# Patient Record
Sex: Female | Born: 1990 | Marital: Single | Smoking: Never smoker
Health system: Southern US, Community
[De-identification: ages and names within clinical notes are randomized; demographics above are authoritative.]

## PROBLEM LIST (undated history)

## (undated) DIAGNOSIS — Z872 Personal history of diseases of the skin and subcutaneous tissue: Secondary | ICD-10-CM

## (undated) HISTORY — DX: Personal history of diseases of the skin and subcutaneous tissue: Z87.2

---

## 2019-10-17 ENCOUNTER — Ambulatory Visit: Payer: BC Managed Care – PPO | Attending: Internal Medicine

## 2019-10-17 DIAGNOSIS — Z20822 Contact with and (suspected) exposure to covid-19: Secondary | ICD-10-CM

## 2019-10-18 LAB — NOVEL CORONAVIRUS, NAA: SARS-CoV-2, NAA: NOT DETECTED

## 2019-10-30 ENCOUNTER — Ambulatory Visit: Payer: BC Managed Care – PPO | Attending: Internal Medicine

## 2019-10-30 DIAGNOSIS — Z20822 Contact with and (suspected) exposure to covid-19: Secondary | ICD-10-CM | POA: Insufficient documentation

## 2019-10-31 LAB — NOVEL CORONAVIRUS, NAA: SARS-CoV-2, NAA: NOT DETECTED

## 2019-11-24 ENCOUNTER — Ambulatory Visit: Payer: Self-pay

## 2019-12-04 DIAGNOSIS — J189 Pneumonia, unspecified organism: Secondary | ICD-10-CM | POA: Diagnosis not present

## 2019-12-04 DIAGNOSIS — Z20822 Contact with and (suspected) exposure to covid-19: Secondary | ICD-10-CM | POA: Diagnosis not present

## 2019-12-04 DIAGNOSIS — R05 Cough: Secondary | ICD-10-CM | POA: Diagnosis not present

## 2019-12-05 ENCOUNTER — Ambulatory Visit: Payer: Self-pay | Attending: Internal Medicine

## 2019-12-05 DIAGNOSIS — Z23 Encounter for immunization: Secondary | ICD-10-CM | POA: Insufficient documentation

## 2019-12-05 NOTE — Progress Notes (Signed)
   Covid-19 Vaccination Clinic  Name:  [name]    MRN: [id number] DOB: 05-Jan-1991  12/05/2019  [name] was observed post Covid-19 immunization for 15 minutes without incident. She was provided with Vaccine Information Sheet and instruction to access the V-Safe system.   [name] was instructed to call 911 with any severe reactions post vaccine: Marland Kitchen Difficulty breathing  . Swelling of face and throat  . A fast heartbeat  . A bad rash all over body  . Dizziness and weakness   Immunizations Administered    Name Date Dose VIS Date Route   Pfizer COVID-19 Vaccine 12/05/2019  8:47 AM 0.3 mL 09/12/2019 Intramuscular   Manufacturer: [place]   Lot: [id number]   [place]: [id number]

## 2019-12-11 DIAGNOSIS — R05 Cough: Secondary | ICD-10-CM | POA: Diagnosis not present

## 2019-12-11 DIAGNOSIS — J189 Pneumonia, unspecified organism: Secondary | ICD-10-CM | POA: Diagnosis not present

## 2019-12-26 ENCOUNTER — Ambulatory Visit: Payer: Self-pay | Attending: Internal Medicine

## 2019-12-26 ENCOUNTER — Other Ambulatory Visit: Payer: Self-pay

## 2019-12-26 DIAGNOSIS — Z23 Encounter for immunization: Secondary | ICD-10-CM

## 2020-01-12 ENCOUNTER — Ambulatory Visit
Admission: EM | Admit: 2020-01-12 | Discharge: 2020-01-12 | Disposition: A | Discharge status: Home / Self Care | Payer: BC Managed Care – PPO

## 2020-01-12 ENCOUNTER — Ambulatory Visit (INDEPENDENT_AMBULATORY_CARE_PROVIDER_SITE_OTHER): Payer: BC Managed Care – PPO

## 2020-01-12 ENCOUNTER — Other Ambulatory Visit: Payer: Self-pay

## 2020-01-12 DIAGNOSIS — R05 Cough: Secondary | ICD-10-CM | POA: Diagnosis not present

## 2020-01-12 DIAGNOSIS — R062 Wheezing: Secondary | ICD-10-CM | POA: Diagnosis not present

## 2020-01-12 DIAGNOSIS — R053 Chronic cough: Secondary | ICD-10-CM

## 2020-01-12 MED ORDER — E-Z SPACER DEVI: 2 refills | Status: DC

## 2020-01-12 MED ORDER — BENZONATATE 100 MG PO CAPS: 200.0000 mg | ORAL_CAPSULE | Freq: Three times a day (TID) | ORAL | 0 refills | Status: DC | PRN

## 2020-01-12 MED ORDER — PREDNISONE 20 MG PO TABS: 40.0000 mg | ORAL_TABLET | Freq: Every day | ORAL | 0 refills | Status: DC

## 2020-01-12 NOTE — ED Triage Notes (Addendum)
Pt states she was diagnosed in March with pneumonia. Given antibiotics x 2 rounds. States she doesn't feel like she ever completely got better and last week started feeling worse cough and had an overnight fever which has resolved. She notices SOB with exertion. Would like repeat CXR

## 2020-01-12 NOTE — Discharge Instructions (Signed)
It was very nice seeing you today in clinic. Thank you for entrusting me with your care.   Please utilize the medications that we discussed. Your prescriptions has been called in to your pharmacy.   Make arrangements to follow up with your regular doctor in 1 week for re-evaluation if not improving. If your symptoms/condition worsens, please seek follow up care either here or in the ER. Please remember, our [place] providers are "right here with you" when you need [place].   Again, it was my pleasure to take care of you today. Thank you for choosing our clinic. I hope that you start to feel better quickly.   Honor Loh, MSN, APRN, FNP-C, CEN Advanced Practice Provider [facility]

## 2020-01-14 NOTE — ED Provider Notes (Signed)
[place], [place]   Name: John Recendiz DOB: 01/07/1991 MRN: [id number] CSN: [id number] PCP: Patient, No Pcp Per  Arrival date and time:  01/12/20 1935  Chief Complaint:  Cough  NOTE: Prior to seeing the patient today, I have reviewed the triage nursing documentation and vital signs. Clinical staff has updated patient's PMH/PSHx, current medication list, and drug allergies/intolerances to ensure comprehensive history available to assist in medical decision making.   History:   HPI: Karnisha Lainhart is a 29 y.o. female who presents today with complaints of chronic cough since March. She was diagnosed with CAP by [facility]; notes unavailable for reviewed. She reports that she has been treated with two different antibiotics (Augmentin and Azithromycin). Patient advising that she does not feel as if she ever really improved. About a week ago, her cough worsened again. She had subjective fevers on Tuesday (5-6 days ago) that were self limiting. Patient denies being in close contact with anyone known to be ill. She has not been tested for SARS-CoV-2 (novel coronavirus) in the past 14 days, and does not wish to be tested today. 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 She presents today requesting a repeat CXR to determine the cause of her continued cough. She has a SABA (albuterol) MDI at home, however notes that it makes her gag with use, which in turn causes her to vomit and urinate on herself. She is not currently taking any sort of anti-tussive medications.   History reviewed. No pertinent past medical history.  History reviewed. No pertinent surgical history.  History reviewed. No pertinent family history.  Social History   Tobacco Use  . Smoking status: Never Smoker  . Smokeless tobacco: Never Used  Substance Use Topics  . Alcohol use: Not Currently  . Drug use: Not Currently    There are no problems to display for this  patient.   Home Medications:    Current Meds  Medication Sig  . levonorgestrel (MIRENA) 20 MCG/24HR IUD 1 each by Intrauterine route once.    Allergies:   Patient has no known allergies.  Review of Systems (ROS):  Review of systems NEGATIVE unless otherwise noted in narrative H&P section.   Vital Signs: Today's Vitals   01/12/20 1944 01/12/20 1947 01/12/20 2025  BP: (!) 148/88    Pulse: 79    Resp: 18    Temp: 98.7 F (37.1 C)    TempSrc: Oral    SpO2: 100%    Weight:  220 lb (99.8 kg)   Height:  5\' 9"  (1.753 m)   PainSc:  0-No pain 0-No pain    Physical Exam: Physical Exam  Constitutional: She is oriented to person, place, and time and well-developed, well-nourished, and in no distress.  HENT:  Head: Normocephalic and atraumatic.  Nose: Nose normal.  Mouth/Throat: Uvula is midline, oropharynx is clear and moist and mucous membranes are normal.  Eyes: Pupils are equal, round, and reactive to light.  Cardiovascular: Normal rate, regular rhythm, normal heart sounds and intact distal pulses.  Pulmonary/Chest: Effort normal. She has wheezes (faint expiratory; scattered).  Mild cough noted in clinic. No SOB or increased WOB. No distress. Able to speak in complete sentences without difficulties. SPO2 100% on RA.  Neurological: She is alert and oriented to person, place, and time. Gait normal.  Skin: Skin is warm and dry. No rash noted. She is not diaphoretic.  Psychiatric: Mood, memory, affect and judgment normal.  Nursing note  and vitals reviewed.   Urgent Care Treatments / Results:   Orders Placed This Encounter  Procedures  . DG Chest 2 View    LABS: PLEASE NOTE: all labs that were ordered this encounter are listed, however only abnormal results are displayed. Labs Reviewed - No data to display  EKG: -None  RADIOLOGY: DG Chest 2 View  Result Date: 01/12/2020 CLINICAL DATA:  Cough EXAM: CHEST - 2 VIEW COMPARISON:  None. FINDINGS: The heart size and  mediastinal contours are within normal limits. Both lungs are clear. The visualized skeletal structures are unremarkable. IMPRESSION: No acute abnormality of the lungs. Electronically Signed   By: Eddie Candle M.D.   On: 01/12/2020 20:03    PROCEDURES: Procedures  MEDICATIONS RECEIVED THIS VISIT: Medications - No data to display  PERTINENT CLINICAL COURSE NOTES/UPDATES:   Initial Impression / Assessment and Plan / Urgent Care Course:  Pertinent labs & imaging results that were available during my care of the patient were personally reviewed by me and considered in my medical decision making (see lab/imaging section of note for values and interpretations).  Nichelle Lawyer is a 29 y.o. female who presents to [facility] today with complaints of Cough  Patient is well appearing overall in clinic today. She does not appear to be in any acute distress. Presenting symptoms (see HPI) and exam as documented above. Patient with cough since March following her CAP diagnosis. She has been treated with two antibiotics. Cough persists. No fevers today. She is eating and drinking well. Declines SARS-CoV-2 testing today citing that she has completed the two step Pfizer series. Radiographs of the chest performed today revealed no acute cardiopulmonary process; no evidence of peribronchial thickening, areas of consolidation, or focal infiltrates. Cough is chronic at this point. Will provide a short steroid burst to help with her symptoms. Recommended for her to continue SABA inhaler, however she advises it makes her sick. Will trial with a spacer; Rx sent to patient's pharmacy. Discussed supportive care measures at home. Patient to rest as much as possible. She was encouraged to ensure adequate hydration (water and ORS) to prevent dehydration and electrolyte derangements. Patient may use APAP and/or IBU on an as needed basis for pain/fever. Will send in a supply of benzonatate (Tessalon) for patient to use on  a PRN basis to help with her cough.   Discussed follow up with primary care physician in 1 week for re-evaluation. I have reviewed the follow up and strict return precautions for any new or worsening symptoms. Patient is aware of symptoms that would be deemed urgent/emergent, and would thus require further evaluation either here or in the emergency department. At the time of discharge, she verbalized understanding and consent with the discharge plan as it was reviewed with her. All questions were fielded by provider and/or clinic staff prior to patient discharge.    Final Clinical Impressions / Urgent Care Diagnoses:   Final diagnoses:  Chronic cough    New Prescriptions:  [place] Controlled Substance Registry consulted? Not Applicable  Meds ordered this encounter  Medications  . predniSONE (DELTASONE) 20 MG tablet    Sig: Take 2 tablets (40 mg total) by mouth daily.    Dispense:  10 tablet    Refill:  0  . benzonatate (TESSALON) 100 MG capsule    Sig: Take 2 capsules (200 mg total) by mouth 3 (three) times daily as needed for cough.    Dispense:  21 capsule    Refill:  0  .  Spacer/Aero-Holding Chambers (E-Z SPACER) inhaler    Sig: Use as instructed    Dispense:  1 each    Refill:  2    Recommended Follow up Care:  Patient encouraged to follow up with the following provider within the specified time frame, or sooner as dictated by the severity of her symptoms. As always, she was instructed that for any urgent/emergent care needs, she should seek care either here or in the emergency department for more immediate evaluation.  Follow-up Information    PCP In 1 week.   Why: General reassessment of symptoms if not improving        NOTE: This note was prepared using Lobbyist along with smaller Company secretary. Despite my best ability to proofread, there is the potential that transcriptional errors may still occur from this process, and are completely unintentional.      Karen Kitchens, NP 01/14/20 2018

## 2020-02-02 DIAGNOSIS — L209 Atopic dermatitis, unspecified: Secondary | ICD-10-CM | POA: Diagnosis not present

## 2020-05-03 ENCOUNTER — Other Ambulatory Visit: Payer: Self-pay

## 2020-05-03 ENCOUNTER — Other Ambulatory Visit: Payer: BC Managed Care – PPO

## 2020-05-03 DIAGNOSIS — Z20822 Contact with and (suspected) exposure to covid-19: Secondary | ICD-10-CM | POA: Diagnosis not present

## 2020-05-04 LAB — SARS-COV-2, NAA 2 DAY TAT

## 2020-05-04 LAB — NOVEL CORONAVIRUS, NAA: SARS-CoV-2, NAA: NOT DETECTED

## 2020-05-10 ENCOUNTER — Other Ambulatory Visit: Payer: BC Managed Care – PPO

## 2020-06-12 DIAGNOSIS — Z20822 Contact with and (suspected) exposure to covid-19: Secondary | ICD-10-CM | POA: Diagnosis not present

## 2021-01-24 ENCOUNTER — Ambulatory Visit (INDEPENDENT_AMBULATORY_CARE_PROVIDER_SITE_OTHER): Payer: BC Managed Care – PPO

## 2021-01-24 ENCOUNTER — Other Ambulatory Visit: Payer: Self-pay

## 2021-01-24 ENCOUNTER — Ambulatory Visit (HOSPITAL_COMMUNITY)
Admission: RE | Admit: 2021-01-24 | Discharge: 2021-01-24 | Disposition: A | Discharge status: Home / Self Care | Payer: BC Managed Care – PPO | Source: Ambulatory Visit

## 2021-01-24 ENCOUNTER — Encounter (HOSPITAL_COMMUNITY): Payer: Self-pay

## 2021-01-24 VITALS — BP 127/73 | HR 75 | Temp 98.6°F | Resp 18

## 2021-01-24 DIAGNOSIS — R059 Cough, unspecified: Secondary | ICD-10-CM

## 2021-01-24 MED ORDER — MONTELUKAST SODIUM 10 MG PO TABS: 10.0000 mg | ORAL_TABLET | Freq: Every day | ORAL | 0 refills | Status: DC

## 2021-01-24 MED ORDER — ALBUTEROL SULFATE HFA 108 (90 BASE) MCG/ACT IN AERS: 1.0000 | INHALATION_SPRAY | Freq: Four times a day (QID) | RESPIRATORY_TRACT | 0 refills | Status: DC | PRN

## 2021-01-24 MED ORDER — LEVOCETIRIZINE DIHYDROCHLORIDE 5 MG PO TABS: 5.0000 mg | ORAL_TABLET | Freq: Every evening | ORAL | 0 refills | Status: DC

## 2021-01-24 MED ORDER — PROMETHAZINE-DM 6.25-15 MG/5ML PO SYRP
5.0000 mL | ORAL_SOLUTION | Freq: Every evening | ORAL | 0 refills | Status: DC | PRN
Start: 2021-01-24 — End: 2021-11-07

## 2021-01-24 NOTE — ED Triage Notes (Signed)
Pt reports cough x 2 weeks. Pt states she "feel the same" as when se had pneumonia last year.  Pt denies any other symptoms.   Reports negative rapid COVID test at home.

## 2021-01-24 NOTE — ED Provider Notes (Signed)
[place]   MRN: [id number] DOB: 1990/12/30  Subjective:   Anzleigh Slaven is a 30 y.o. female presenting for 2-week history of persistent cough, chest tightness, malaise and fatigue.  Patient states that she had pneumonia last year feels like this is happening again.  Denies chest pain, shortness of breath, fevers, body aches.  She did do a COVID test at home and was negative.  She denies any history of respiratory disorders, is not a smoker.  No current facility-administered medications for this encounter.  Current Outpatient Medications:  .  loratadine (CLARITIN) 10 MG tablet, Take 10 mg by mouth daily., Disp: , Rfl:  .  albuterol (VENTOLIN HFA) 108 (90 Base) MCG/ACT inhaler, SMARTSIG:2 Puff(s) By Mouth Every 4 Hours PRN, Disp: , Rfl:  .  benzonatate (TESSALON) 100 MG capsule, Take 2 capsules (200 mg total) by mouth 3 (three) times daily as needed for cough., Disp: 21 capsule, Rfl: 0 .  levonorgestrel (MIRENA) 20 MCG/24HR IUD, 1 each by Intrauterine route once., Disp: , Rfl:  .  predniSONE (DELTASONE) 20 MG tablet, Take 2 tablets (40 mg total) by mouth daily., Disp: 10 tablet, Rfl: 0 .  Spacer/Aero-Holding Chambers (E-Z SPACER) inhaler, Use as instructed, Disp: 1 each, Rfl: 2   No Known Allergies  History reviewed. No pertinent past medical history.   History reviewed. No pertinent surgical history.  History reviewed. No pertinent family history.  Social History   Tobacco Use  . Smoking status: Never Smoker  . Smokeless tobacco: Never Used  Vaping Use  . Vaping Use: Never used  Substance Use Topics  . Alcohol use: Not Currently  . Drug use: Not Currently    ROS   Objective:   Vitals: BP 127/73 (BP Location: Left Arm)   Pulse 75   Temp 98.6 F (37 C) (Oral)   Resp 18   SpO2 96%   Physical Exam Constitutional:      General: She is not in acute distress.    Appearance: Normal appearance. She is well-developed. She is not ill-appearing,  toxic-appearing or diaphoretic.  HENT:     Head: Normocephalic and atraumatic.     Nose: Nose normal.     Mouth/Throat:     Mouth: Mucous membranes are moist.  Eyes:     Extraocular Movements: Extraocular movements intact.     Pupils: Pupils are equal, round, and reactive to light.  Cardiovascular:     Rate and Rhythm: Normal rate and regular rhythm.     Pulses: Normal pulses.     Heart sounds: Normal heart sounds. No murmur heard. No friction rub. No gallop.   Pulmonary:     Effort: Pulmonary effort is normal. No respiratory distress.     Breath sounds: No stridor. No wheezing, rhonchi or rales.     Comments: Slightly decreased lung sounds in the bibasilar fields. Skin:    General: Skin is warm and dry.     Findings: No rash.  Neurological:     Mental Status: She is alert and oriented to person, place, and time.  Psychiatric:        Mood and Affect: Mood normal.        Behavior: Behavior normal.        Thought Content: Thought content normal.     DG Chest 2 View  Result Date: 01/24/2021 CLINICAL DATA:  Cough for 2 weeks EXAM: CHEST - 2 VIEW COMPARISON:  Prior chest x-ray 01/12/2020 FINDINGS: The lungs are clear and  negative for focal airspace consolidation, pulmonary edema or suspicious pulmonary nodule. No pleural effusion or pneumothorax. Cardiac and mediastinal contours are within normal limits. No acute fracture or lytic or blastic osseous lesions. The visualized upper abdominal bowel gas pattern is unremarkable. IMPRESSION: No active cardiopulmonary disease. Electronically Signed   By: Jacqulynn Cadet M.D.   On: 01/24/2021 10:53     Assessment and Plan :   PDMP not reviewed this encounter.  1. Cough     Patient had similar symptoms set a year ago.  There is no sign of an acute cardiopulmonary process.  Recommend use an allergy regimen given the timeline of her symptoms both for the spring season and the last.  Recommended Xyzal, Singulair, albuterol and general  supportive care.  Deferred COVID-19 testing.  Recommended she establish care with a new PCP and if her respiratory symptoms persist, contact [place] pulmonary care for consultation. Counseled patient on potential for adverse effects with medications prescribed/recommended today, ER and return-to-clinic precautions discussed, patient verbalized understanding.    Jaynee Eagles, PA-C 01/24/21 1314

## 2021-03-21 DIAGNOSIS — M419 Scoliosis, unspecified: Secondary | ICD-10-CM | POA: Diagnosis not present

## 2021-03-21 DIAGNOSIS — M5442 Lumbago with sciatica, left side: Secondary | ICD-10-CM | POA: Diagnosis not present

## 2021-03-26 ENCOUNTER — Encounter (HOSPITAL_COMMUNITY): Payer: Self-pay

## 2021-03-26 ENCOUNTER — Other Ambulatory Visit: Payer: Self-pay

## 2021-03-26 ENCOUNTER — Ambulatory Visit (HOSPITAL_COMMUNITY)
Admission: RE | Admit: 2021-03-26 | Discharge: 2021-03-26 | Disposition: A | Discharge status: Home / Self Care | Payer: BC Managed Care – PPO | Source: Ambulatory Visit | Attending: Urgent Care | Admitting: Urgent Care

## 2021-03-26 VITALS — BP 131/91 | HR 96 | Temp 98.3°F | Resp 16

## 2021-03-26 DIAGNOSIS — R200 Anesthesia of skin: Secondary | ICD-10-CM

## 2021-03-26 DIAGNOSIS — M545 Low back pain, unspecified: Secondary | ICD-10-CM

## 2021-03-26 MED ORDER — GABAPENTIN 100 MG PO CAPS: 100.0000 mg | ORAL_CAPSULE | Freq: Three times a day (TID) | ORAL | 0 refills | Status: DC

## 2021-03-26 MED ORDER — NAPROXEN 375 MG PO TABS: 375.0000 mg | ORAL_TABLET | Freq: Two times a day (BID) | ORAL | 0 refills | Status: DC

## 2021-03-26 MED ORDER — TIZANIDINE HCL 4 MG PO TABS: 4.0000 mg | ORAL_TABLET | Freq: Three times a day (TID) | ORAL | 0 refills | Status: DC | PRN

## 2021-03-26 NOTE — ED Triage Notes (Signed)
Pt present back pain with numbness that has gotten worst. Pt states she previous went to an urgent care for the same symptoms on Monday and was informed to come back if numbness has gotten worst.

## 2021-11-07 ENCOUNTER — Encounter: Payer: Self-pay | Admitting: Physician Assistant

## 2021-11-07 ENCOUNTER — Ambulatory Visit: Payer: BC Managed Care – PPO | Admitting: Physician Assistant

## 2021-11-07 ENCOUNTER — Other Ambulatory Visit: Payer: Self-pay

## 2021-11-07 VITALS — BP 126/84 | HR 70 | Temp 98.5°F | Ht 68.11 in | Wt 248.4 lb

## 2021-11-07 DIAGNOSIS — E049 Nontoxic goiter, unspecified: Secondary | ICD-10-CM | POA: Diagnosis not present

## 2021-11-07 DIAGNOSIS — R5383 Other fatigue: Secondary | ICD-10-CM | POA: Diagnosis not present

## 2021-11-07 DIAGNOSIS — M79642 Pain in left hand: Secondary | ICD-10-CM

## 2021-11-07 DIAGNOSIS — M79641 Pain in right hand: Secondary | ICD-10-CM

## 2021-11-07 DIAGNOSIS — F341 Dysthymic disorder: Secondary | ICD-10-CM | POA: Diagnosis not present

## 2021-11-07 DIAGNOSIS — R7303 Prediabetes: Secondary | ICD-10-CM | POA: Insufficient documentation

## 2021-11-07 MED ORDER — MELOXICAM 15 MG PO TABS: 15.0000 mg | ORAL_TABLET | Freq: Every day | ORAL | 0 refills | Status: DC

## 2021-11-07 NOTE — Patient Instructions (Signed)
It was great to see you!  Start mobic 15 mg on the days that you work  Trial the exercises for your hand  We will update blood work today  Let's follow-up in 4-6 weeks to follow up on your mood, fatigue and discuss weight loss, sooner if you have concerns.  Take care,  Inda Coke PA-C

## 2021-11-07 NOTE — Progress Notes (Signed)
Victoria Barnett is a 31 y.o. female here to establish care.  History of Present Illness:   Chief Complaint  Patient presents with   Establish Care    Pt coming in to establish care; pt is requesting to have thyroid checked Pt c/o developing arthritis in hands and possible ankles; also interested in weight management    Acute Concerns: Enlarged Thyroid  According to pt, while she was undergoing a routine physical examination in [place] , she was found to have an enlarged thyroid. States that following this she had an ultrasound completed, but is unable to remember exactly what was found. At this time she is interested in updating her blood work and/or ultrasound regularly to make sure nothing significant is occuring.   Bilateral Hand Pain For the past couple of months, pt has been experiencing bilateral hand pain. 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 Expresses she feels an achy sensation across her knuckle area that has gotten as bad as a 4 out of 10.   Denies numbness/tingling, weakness, radiating pain, or decreased ROM.  Fatigue  Victoria Barnett expresses she has been experiencing increased feelings of fatigue. According to pt, she is unsure as to why she is feeling as tired since she only works 3 days a week, leaving her 4 days to rest. Pt does admit that she has felt a decrease in her mood, but isn't sure that could be contributing to this issue. At this time, she has been consuming a mostly vegan diet for 6 months, but has been taking a daily protein powder to provide her with multivitamins. Denies concerns for sleep apnea, changes in routine, or racing thoughts.   Hx of Pre-Diabetes Victoria Barnett states that following routine blood work screening provided by her job a couple of years ago, she was told her HgbA1c was in the pre-diabetic range. Pt states since then she has made it a point to no longer be in this pre-diabetic or diabetic range.  She was unable to remember or find the actual lab result today, but was able to find recent A1c that was 4.8. Denies concerning sx.    Depression screen PHQ 2/9 11/07/2021  Decreased Interest 2  Down, Depressed, Hopeless 0  PHQ - 2 Score 2  Altered sleeping 0  Tired, decreased energy 2  Change in appetite 2  Feeling bad or failure about yourself  0  Trouble concentrating 2  Moving slowly or fidgety/restless 0  Suicidal thoughts 0  PHQ-9 Score 8  Difficult doing work/chores Somewhat difficult    No flowsheet data found.   Other providers/specialists: Patient Care Team: Inda Coke, [place] as PCP - General (Physician Assistant)   History reviewed. No pertinent past medical history.   Social History   Tobacco Use   Smoking status: Never   Smokeless tobacco: Never  Vaping Use   Vaping Use: Never used  Substance Use Topics   Alcohol use: Not Currently   Drug use: Never    History reviewed. No pertinent surgical history.  Family History  Problem Relation Age of Onset   Lupus Maternal Grandmother    Arthritis Maternal Grandmother    Diabetes Maternal Grandfather    Dementia Maternal Grandfather    Esophageal cancer Paternal Grandmother    Thyroid cancer Cousin    Diabetes Cousin    Colon cancer Neg Hx    Breast cancer Neg Hx     No Known Allergies  Current Medications:   Current Outpatient Medications:    meloxicam (MOBIC) 15 MG tablet, Take 1 tablet (15 mg total) by mouth daily., Disp: 30 tablet, Rfl: 0   levonorgestrel (MIRENA) 20 MCG/24HR IUD, 1 each by Intrauterine route once., Disp: , Rfl:    Review of Systems:   ROS Negative unless otherwise specified per HPI.  Vitals:   Vitals:   11/07/21 1322  BP: 126/84  Pulse: 70  Temp: 98.5 F (36.9 C)  TempSrc: Temporal  SpO2: 98%  Weight: 248 lb 6.4 oz (112.7 kg)  Height: 5' 8.11" (1.73 m)      Body mass index is 37.65 kg/m.  Physical Exam:   Physical Exam Vitals and nursing note reviewed.   Constitutional:      General: She is not in acute distress.    Appearance: She is well-developed. She is not ill-appearing or toxic-appearing.  Cardiovascular:     Rate and Rhythm: Normal rate and regular rhythm.     Pulses: Normal pulses.     Heart sounds: Normal heart sounds, S1 normal and S2 normal.  Pulmonary:     Effort: Pulmonary effort is normal.     Breath sounds: Normal breath sounds.  Skin:    General: Skin is warm and dry.  Neurological:     Mental Status: She is alert.     GCS: GCS eye subscore is 4. GCS verbal subscore is 5. GCS motor subscore is 6.  Psychiatric:        Speech: Speech normal.        Behavior: Behavior normal. Behavior is cooperative.    Assessment and Plan:   Prediabetes No evidence based on blood work that she has brought from [employer] screenings Will continue to work on healthy lifestyle efforts, I have encouraged her to follow-up with me in a month or so to discuss weight loss  Bilateral hand pain No red flags  Suspect pain 2/2 over-use Start meloxicam 15 mg daily on days when she works Trial hand exercises for additional relief--provided handout information Follow up in 4-6 weeks, sooner if concerns -- will refer to sports medicine if lack of improvement or changes  Other fatigue; Dysthymia No red flags Does have some mild depression, but declines SI/HI or treatment for this at this time Blood work from [employer] reviewed and does not show any abnormalities Will update B12 given recent vegan diet changes and thyroid panel per her request Encouraged adequate rest, fluids -- consider additional work-up in 4-6 weeks if ongoing/worsening  - Vitamin B12   Enlarged thyroid Update labs today, will make recommendations accordingly  We are requesting records from her u/s in [place] Consider re-ordering u/s if we do not get any results or if abnormal thyroid labs   I,Havlyn C Ratchford,acting as a scribe for [employer], PA.,have documented all  relevant documentation on the behalf of Inda Coke, PA,as directed by  Inda Coke, PA while in the presence of Inda Coke, [place].  I, Inda Coke, [place], have reviewed all documentation for this visit. The documentation on 11/07/21 for the exam, diagnosis, procedures, and orders are all accurate and complete.   Inda Coke, PA-C

## 2021-11-08 LAB — T4, FREE: Free T4: 1.22 ng/dL (ref 0.82–1.77)

## 2021-11-08 LAB — TSH: TSH: 1.4 u[IU]/mL (ref 0.450–4.500)

## 2021-11-08 LAB — VITAMIN B12: Vitamin B-12: 480 pg/mL (ref 232–1245)

## 2021-12-15 ENCOUNTER — Ambulatory Visit: Payer: BC Managed Care – PPO | Admitting: Physician Assistant

## 2021-12-15 ENCOUNTER — Encounter: Payer: Self-pay | Admitting: Physician Assistant

## 2021-12-15 VITALS — BP 120/74 | HR 85 | Temp 98.2°F | Ht 68.11 in | Wt 247.0 lb

## 2021-12-15 DIAGNOSIS — M79641 Pain in right hand: Secondary | ICD-10-CM

## 2021-12-15 DIAGNOSIS — E669 Obesity, unspecified: Secondary | ICD-10-CM | POA: Diagnosis not present

## 2021-12-15 DIAGNOSIS — E049 Nontoxic goiter, unspecified: Secondary | ICD-10-CM | POA: Diagnosis not present

## 2021-12-15 DIAGNOSIS — Z23 Encounter for immunization: Secondary | ICD-10-CM | POA: Diagnosis not present

## 2021-12-15 DIAGNOSIS — R5383 Other fatigue: Secondary | ICD-10-CM

## 2021-12-15 DIAGNOSIS — M79642 Pain in left hand: Secondary | ICD-10-CM

## 2021-12-15 MED ORDER — PHENTERMINE HCL 37.5 MG PO TABS: ORAL_TABLET | ORAL | 2 refills | Status: DC

## 2021-12-15 NOTE — Progress Notes (Signed)
Victoria Barnett is a 31 y.o. female here for a follow up of bilateral hand pain, fatigue, and thyroid.  ? ?History of Present Illness:  ? ?Chief Complaint  ?Patient presents with  ? Thyroid Problem  ?  Pt is here to F/U with her Thyroids.  ? ? ?HPI ? ?Bilateral Hand Pain  ?Victoria Barnett presents for f/u of bilateral hand pain and reports she has been compliant with taking meloxicam 15 mg with no adverse effects. States she takes this three times weekly, usually on the days she works and has found it beneficial.  ? ?Fatigue ?In terms of fatigue, pt states this has improved since our last visit. Due to previously completed blood work showing no abnormalities, she is not interested in further workup of this issue. Denies concerning sx.  ? ?Enlarged Thyroid ?During our previous visit, pt filled out information to have her records from [place] shared with [place]. Unfortunately this was not completed on their end. Currently she is in agreement to restart further evaluation of this issue including an updated [place] of her thyroid. Thyroid labs at last visit were WNL. ? ?Obesity ?Currently pt expresses that although she has tried to adjust her diet to mostly vegan and increase her daily exercise, she is not losing weight. States she has been consistent in walking about 2 miles daily as well as recently using her stationary bike. With all of this effort and nothing to show for it, Victoria Barnett is currently wondering what more she could do to get to where she wants to be. At this time she is interested in trialing a weight loss medication to give her a boost.  ? ?History reviewed. No pertinent past medical history. ?  ?Social History  ? ?Tobacco Use  ? Smoking status: Never  ? Smokeless tobacco: Never  ?Vaping Use  ? Vaping Use: Never used  ?Substance Use Topics  ? Alcohol use: Not Currently  ? Drug use: Never  ? ? ?History reviewed. No pertinent surgical history. ? ?Family History  ?Problem Relation Age of Onset  ? Lupus Maternal Grandmother   ?  Arthritis Maternal Grandmother   ? Diabetes Maternal Grandfather   ? Dementia Maternal Grandfather   ? Esophageal cancer Paternal Grandmother   ? Thyroid cancer Cousin   ? Diabetes Cousin   ? Colon cancer Neg Hx   ? Breast cancer Neg Hx   ? ? ?No Known Allergies ? ?Current Medications:  ? ?Current Outpatient Medications:  ?  levonorgestrel (MIRENA) 20 MCG/24HR IUD, 1 each by Intrauterine route once., Disp: , Rfl:  ?  meloxicam (MOBIC) 15 MG tablet, Take 1 tablet (15 mg total) by mouth daily., Disp: 30 tablet, Rfl: 0  ? ?Review of Systems:  ? ?ROS ?Negative unless otherwise specified per HPI. ?Vitals:  ? ?Vitals:  ? 12/15/21 0850  ?BP: 120/74  ?Pulse: 85  ?Temp: 98.2 ?F (36.8 ?C)  ?SpO2: 97%  ?Weight: 247 lb (112 kg)  ?Height: 5' 8.11" (1.73 m)  ?   ?Body mass index is 37.44 kg/m?. ? ?Physical Exam:  ? ?Physical Exam ?Vitals and nursing note reviewed.  ?Constitutional:   ?   General: She is not in acute distress. ?   Appearance: She is well-developed. She is not ill-appearing or toxic-appearing.  ?Cardiovascular:  ?   Rate and Rhythm: Normal rate and regular rhythm.  ?   Pulses: Normal pulses.  ?   Heart sounds: Normal heart sounds, S1 normal and S2 normal.  ?Pulmonary:  ?  Effort: Pulmonary effort is normal.  ?   Breath sounds: Normal breath sounds.  ?Skin: ?   General: Skin is warm and dry.  ?Neurological:  ?   Mental Status: She is alert.  ?   GCS: GCS eye subscore is 4. GCS verbal subscore is 5. GCS motor subscore is 6.  ?Psychiatric:     ?   Speech: Speech normal.     ?   Behavior: Behavior normal. Behavior is cooperative.  ? ? ?Assessment and Plan:  ? ?Bilateral hand pain ?Improved ?Continue meloxicam 15 mg daily as needed  ?Instructed that if she has increased gastritis sx to cut tablet in half and/or take OTC prilosec for her sx ?Follow up as needed  ? ?Enlarged thyroid ?No red flags ?US Thyroid ordered  ?Follow up based on results  ? ?Fatigue ?Improved ?Continue to  monitor ? ?Obese ?Uncontrolled ?Unfortunately she has a cousin with thyroid cancer and she has thyromegaly and we have no recent work-up on this -- I'm not quite comfortable with starting GLP-1 ?Will trial 37.5 mg phentermine -- recommend cutting in half and taking ?Side effects and risks discussed -- patient verbalized understanding and in agreement ?Follow-up in 3 months, sooner if concerns ? ?I,Havlyn C Ratchford,acting as a scribe for [employer], PA.,have documented all relevant documentation on the behalf of Inda Coke, PA,as directed by  Inda Coke, PA while in the presence of Inda Coke, [place]. ? ?IInda Coke, PA, have reviewed all documentation for this visit. The documentation on 12/15/21 for the exam, diagnosis, procedures, and orders are all accurate and complete. ? ? ?Inda Coke, PA-C ? ?

## 2021-12-15 NOTE — Patient Instructions (Signed)
It was great to see you! ? ?Start phentermine as prescribed -- if any concerning symptoms (chest pain, palpitations -- stop this and call our office) ? ?You will be contacted about ultrasound  ? ?Consider over the counter prilosec (generic is fine) if the mobic keeps causing upset stomach ? ?Let's follow-up in 3 months, sooner if you have concerns. ? ?Take care, ? ?Inda Coke PA-C  ?

## 2021-12-22 ENCOUNTER — Encounter: Payer: Self-pay | Admitting: Obstetrics & Gynecology

## 2021-12-22 ENCOUNTER — Ambulatory Visit: Payer: BC Managed Care – PPO | Admitting: Obstetrics & Gynecology

## 2021-12-22 ENCOUNTER — Other Ambulatory Visit: Payer: Self-pay

## 2021-12-22 ENCOUNTER — Other Ambulatory Visit (HOSPITAL_COMMUNITY)
Admission: RE | Admit: 2021-12-22 | Discharge: 2021-12-22 | Disposition: A | Discharge status: Home / Self Care | Payer: BC Managed Care – PPO | Source: Ambulatory Visit | Attending: Obstetrics & Gynecology | Admitting: Obstetrics & Gynecology

## 2021-12-22 VITALS — BP 129/84 | HR 91 | Wt 241.0 lb

## 2021-12-22 DIAGNOSIS — Z01419 Encounter for gynecological examination (general) (routine) without abnormal findings: Secondary | ICD-10-CM | POA: Insufficient documentation

## 2021-12-22 DIAGNOSIS — Z872 Personal history of diseases of the skin and subcutaneous tissue: Secondary | ICD-10-CM | POA: Diagnosis not present

## 2021-12-22 DIAGNOSIS — Z30430 Encounter for insertion of intrauterine contraceptive device: Secondary | ICD-10-CM

## 2021-12-22 MED ORDER — MISOPROSTOL 200 MCG PO TABS: ORAL_TABLET | ORAL | 2 refills | Status: DC

## 2021-12-22 MED ORDER — LORAZEPAM 0.5 MG PO TABS: 0.5000 mg | ORAL_TABLET | Freq: Four times a day (QID) | ORAL | 0 refills | Status: AC | PRN

## 2021-12-22 MED ORDER — NAPROXEN SODIUM 550 MG PO TABS: 550.0000 mg | ORAL_TABLET | Freq: Two times a day (BID) | ORAL | 2 refills | Status: DC

## 2021-12-22 NOTE — Progress Notes (Signed)
? ? ?GYNECOLOGY ANNUAL PREVENTATIVE CARE ENCOUNTER NOTE ? ?History:    ? Victoria Barnett is a 31 y.o. No obstetric history on file. female here for a routine annual gynecologic exam.  Current complaints: she had her current Mirena IUD inserted seven years ago in [place], and she would like a new one because she is worried about them being outlawed. Her first IUD insertion was done with pain medication and cytotec and she would like the same. She also requests a breast exam and PAP. 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 She states she has not been sexually active for several years. Denies abnormal vaginal bleeding, discharge, pelvic pain, problems with intercourse or other gynecologic concerns.  ?  ?Gynecologic History ?No LMP recorded. (Menstrual status: IUD). ?Contraception:  Mirena IUD ?Last Pap: 7 years ago in 2016. Patient reports result was negative with negative HPV ? ?Obstetric History ?OB History  ?No obstetric history on file.  ? ? ?History reviewed. No pertinent past medical history. ? ?History reviewed. No pertinent surgical history. ? ?Current Outpatient Medications on File Prior to Visit  ?Medication Sig Dispense Refill  ? levonorgestrel (MIRENA) 20 MCG/24HR IUD 1 each by Intrauterine route once.    ? meloxicam (MOBIC) 15 MG tablet Take 1 tablet (15 mg total) by mouth daily. (Patient taking differently: Take 15 mg by mouth daily. Takes on days she works- Friday, Saturday, sundays) 30 tablet 0  ? phentermine (ADIPEX-P) 37.5 MG tablet Take 1/2 tablet daily x 2 weeks and then daily 30 tablet 2  ? ?No current facility-administered medications on file prior to visit.  ? ? ?No Known Allergies ? ?Social History:  reports that she has never smoked. She has never used smokeless tobacco. She reports that she does not currently use alcohol. She reports that she does not use drugs. ? ?Family History  ?Problem Relation Age of Onset  ? Lupus Maternal Grandmother   ?  Arthritis Maternal Grandmother   ? Diabetes Maternal Grandfather   ? Dementia Maternal Grandfather   ? Esophageal cancer Paternal Grandmother   ? Thyroid cancer Cousin   ? Diabetes Cousin   ? Colon cancer Neg Hx   ? Breast cancer Neg Hx   ? ? ?The following portions of the patient's history were reviewed and updated as appropriate: allergies, current medications, past family history, past medical history, past social history, past surgical history and problem list. ? ?Review of Systems ?Pertinent items noted in HPI and remainder of comprehensive ROS otherwise negative. ? ?Physical Exam:  ?BP 129/84   Pulse 91   Wt 241 lb (109.3 kg)   BMI 36.53 kg/m?  ?CONSTITUTIONAL: Well-developed, well-nourished female in no acute distress.  ?HENT:  Normocephalic, atraumatic, External right and left ear normal. Oropharynx is clear and moist ?EYES: Conjunctivae and EOM are normal. Pupils are equal, round, and reactive to light. No scleral icterus.  ?NECK: Normal range of motion, supple, no masses.  Normal thyroid.  ?SKIN:  Skin is warm and dry. No rash noted. Not diaphoretic. No erythema. No pallor. Some scarring from round lesions in axilla and vulva. ?MUSCULOSKELETAL: Normal range of motion. No tenderness.  No cyanosis, clubbing, or edema.  2+ distal pulses. ?NEUROLOGIC: Alert and oriented to person, place, and time. Normal reflexes, muscle tone coordination.  ?PSYCHIATRIC: Normal mood and affect. Normal behavior. Normal judgment and thought content. ?CARDIOVASCULAR: Normal heart rate noted, regular rhythm ?RESPIRATORY: Clear to auscultation bilaterally. Effort and breath sounds  normal, no problems with respiration noted. ?BREASTS: Symmetric in size. Fibrocystic breast changes noted in the periareolar region bilaterally. No masses, tenderness, skin changes, nipple drainage, or lymphadenopathy bilaterally. ?ABDOMEN: Soft, no distention noted.  No tenderness, rebound or guarding.  ?PELVIC: Normal appearing external genitalia and  urethral meatus; normal appearing vaginal mucosa and cervix.  No abnormal vaginal discharge noted.  Pap smear obtained.  Normal uterine size, no other palpable masses, no uterine or adnexal tenderness.  Performed in the presence of a chaperone. ?  ?Assessment and Plan:  ?  1. Encounter for insertion of mirena IUD preparation ?Patient was given instructions on how to prepare for IUD insertion and is amenable to the plan. ?- misoprostol (CYTOTEC) 200 MCG tablet; Place two tablets in the vagina the night prior to your next clinic appointment  Dispense: 2 tablet; Refill: 2 ?- LORazepam (ATIVAN) 0.5 MG tablet; Take 1 tablet (0.5 mg total) by mouth every 6 (six) hours as needed for anxiety (Around procedure time).  Dispense: 10 tablet; Refill: 0 ?- naproxen sodium (ANAPROX DS) 550 MG tablet; Take 1 tablet (550 mg total) by mouth 2 (two) times daily with a meal. As needed for pain  Dispense: 30 tablet; Refill: 2 ? ?2. Well woman exam with routine gynecological exam ?- Cytology - PAP ?Will follow up results of pap smear and manage accordingly. ?Routine preventative health maintenance measures emphasized. ?Please refer to After Visit Summary for other counseling recommendations.  ?   ? ?Alwyn Ren, Student-PA ?[facility] [facility] ? ? ? ?Attestation of Attending Supervision of Student:  I confirm that I have verified the information documented in the physician assistant student?s note and that I have also personally reperformed the history, physical exam and all medical decision making activities.  I have verified that all services and findings are accurately documented in this student's note; and I agree with management and plan as outlined in the documentation. I have also made any necessary editorial changes. ? ? ?Verita Schneiders, MD, FACOG ?Attending [place], Faculty Practice ?[facility] ? ? ?

## 2021-12-27 ENCOUNTER — Other Ambulatory Visit: Payer: Self-pay

## 2021-12-27 ENCOUNTER — Ambulatory Visit
Admission: RE | Admit: 2021-12-27 | Discharge: 2021-12-27 | Disposition: A | Discharge status: Home / Self Care | Payer: BC Managed Care – PPO | Source: Ambulatory Visit | Attending: Physician Assistant | Admitting: Physician Assistant

## 2021-12-27 DIAGNOSIS — E010 Iodine-deficiency related diffuse (endemic) goiter: Secondary | ICD-10-CM | POA: Diagnosis not present

## 2021-12-27 DIAGNOSIS — E041 Nontoxic single thyroid nodule: Secondary | ICD-10-CM | POA: Diagnosis not present

## 2021-12-27 DIAGNOSIS — E049 Nontoxic goiter, unspecified: Secondary | ICD-10-CM

## 2021-12-27 LAB — CYTOLOGY - PAP
Adequacy: ABSENT
Comment: NEGATIVE
Diagnosis: NEGATIVE
High risk HPV: NEGATIVE

## 2022-01-16 ENCOUNTER — Ambulatory Visit: Payer: BC Managed Care – PPO | Admitting: Obstetrics & Gynecology

## 2022-01-16 ENCOUNTER — Encounter: Payer: Self-pay | Admitting: Obstetrics & Gynecology

## 2022-01-16 VITALS — BP 134/86 | HR 69 | Wt 238.0 lb

## 2022-01-16 DIAGNOSIS — Z30433 Encounter for removal and reinsertion of intrauterine contraceptive device: Secondary | ICD-10-CM | POA: Diagnosis not present

## 2022-01-16 MED ORDER — LEVONORGESTREL 20 MCG/DAY IU IUD
1.0000 | INTRAUTERINE_SYSTEM | Freq: Once | INTRAUTERINE | Status: AC
  Administered 2022-01-16: 1 via INTRAUTERINE

## 2022-01-16 NOTE — Progress Notes (Signed)
? ? ?  GYNECOLOGY OFFICE PROCEDURE NOTE ? ?Victoria Barnett is a 31 y.o. F here for Mirena IUD removal and reinsertion. No GYN concerns.  Last pap smear was on 12/22/2021 and was normal. She took premedication with misoprostol, Ativan and Ibuprofen prior to this encounter. ? ?IUD Removal and Reinsertion  ?Patient identified, informed consent performed, consent signed.   Discussed risks of irregular bleeding, cramping, infection, malpositioning or misplacement of the IUD outside the uterus which may require further procedures. Also discussed >99% contraception efficacy, increased risk of ectopic pregnancy with failure of method.   Emphasized that this did not protect against STIs, condoms recommended during all sexual encounters. Advised to use backup contraception for one week as the risk of pregnancy is higher during the transition period of removing an IUD and replacing it with another one. Time out was performed. Speculum placed in the vagina. The strings of the old Mirena IUD were grasped and pulled using ring forceps. The IUD was successfully removed in its entirety. The cervix was cleaned with Betadine x 2.  1 ml of 2% lidocaine was injected into anterior lip and a single tooth tenaculum was placed.   A paracervical block with 10 ml of 2% lidocaine was placed. The new Mirena IUD insertion apparatus was used to sound the uterus to 8 cm;  the IUD was then placed per manufacturer's recommendations. Strings trimmed to 3 cm. Tenaculum was removed, good hemostasis noted. Patient tolerated procedure well.  ? ?Patient was given post-procedure instructions.  She was reminded to have backup contraception for one week during this transition period between IUDs.  Patient was also asked to check IUD strings periodically and follow up in 4 weeks for IUD check. ? ? ?Verita Schneiders, MD, FACOG ?Obstetrician Social research officer, government, Faculty Practice ?[facility]  ?

## 2022-02-20 ENCOUNTER — Ambulatory Visit: Payer: BC Managed Care – PPO | Admitting: Obstetrics & Gynecology

## 2022-03-16 ENCOUNTER — Encounter: Payer: Self-pay | Admitting: Physician Assistant

## 2022-03-16 ENCOUNTER — Ambulatory Visit: Payer: BC Managed Care – PPO | Admitting: Physician Assistant

## 2022-03-16 VITALS — BP 122/79 | HR 83 | Temp 98.7°F | Ht 68.11 in | Wt 234.8 lb

## 2022-03-16 DIAGNOSIS — R41840 Attention and concentration deficit: Secondary | ICD-10-CM

## 2022-03-16 DIAGNOSIS — E669 Obesity, unspecified: Secondary | ICD-10-CM | POA: Diagnosis not present

## 2022-03-16 DIAGNOSIS — M79672 Pain in left foot: Secondary | ICD-10-CM | POA: Diagnosis not present

## 2022-03-16 DIAGNOSIS — M79642 Pain in left hand: Secondary | ICD-10-CM

## 2022-03-16 DIAGNOSIS — M79641 Pain in right hand: Secondary | ICD-10-CM | POA: Diagnosis not present

## 2022-03-16 MED ORDER — MELOXICAM 15 MG PO TABS: 15.0000 mg | ORAL_TABLET | Freq: Every day | ORAL | 1 refills | Status: DC

## 2022-03-16 MED ORDER — PHENTERMINE HCL 37.5 MG PO TABS: 37.5000 mg | ORAL_TABLET | Freq: Every day | ORAL | 2 refills | Status: DC

## 2022-03-16 NOTE — Patient Instructions (Signed)
It was great to see you!  Consider heel cups for your shoes (google or look on [place] Review handout and trial exercises  I will place referral for ADHD evaluation  Follow-up in 3 months to re-visit the phentermine  Take care,  Inda Coke PA-C

## 2022-03-16 NOTE — Progress Notes (Signed)
Victoria Barnett is a 31 y.o. female here for a follow up of obesity.  History of Present Illness:   Chief Complaint  Patient presents with   Weight Loss    Pt here to F/U with Wt loss    HPI  Obesity  Patient here to 3 months follow-up. She is currently compliant taking Phentermine 37.5 mg daily with no complications. She has noticed weight loss since starting the medication. She has lost 4 pounds since last visit and states this medication is beneficial to her. Tolerating her medication well with no adverse side effects. States she ran out of her medication last week which made her anxious. At this time, she would like to continue this medication. Denies any concerning sx.  Foot pain  Patient complain of foot pain that has been going on for a while. States she has experiencing pain on the arch of the foot. She has been experiencing pain when getting out of bed in the morning. Denies swelling or pain in legs. She said this started after she was power washing over the weekend without shoes on.  ADHD  Patient states she has concerns that she may have ADHD. She is interested in pursuing evaluation for this.  Bilateral Hand Pain  Patient is requesting refill on Meloxicam 15 mg today. States she has not been taking this for a while. Otherwise, she has been tolerating this well with no side effects.   Past Medical History:  Diagnosis Date   History of hidradenitis suppurativa      Social History   Tobacco Use   Smoking status: Never   Smokeless tobacco: Never  Vaping Use   Vaping Use: Never used  Substance Use Topics   Alcohol use: Not Currently   Drug use: Never    History reviewed. No pertinent surgical history.  Family History  Problem Relation Age of Onset   Lupus Maternal Grandmother    Arthritis Maternal Grandmother    Diabetes Maternal Grandfather    Dementia Maternal Grandfather    Esophageal cancer Paternal Grandmother    Thyroid cancer Cousin    Diabetes Cousin     Colon cancer Neg Hx    Breast cancer Neg Hx     No Known Allergies  Current Medications:   Current Outpatient Medications:    LORazepam (ATIVAN) 0.5 MG tablet, Take 1 tablet (0.5 mg total) by mouth every 6 (six) hours as needed for anxiety (Around procedure time)., Disp: 10 tablet, Rfl: 0   meloxicam (MOBIC) 15 MG tablet, Take 1 tablet (15 mg total) by mouth daily. (Patient taking differently: Take 15 mg by mouth daily. Takes on days she works- Friday, Saturday, sundays), Disp: 30 tablet, Rfl: 0   misoprostol (CYTOTEC) 200 MCG tablet, Place two tablets in the vagina the night prior to your next clinic appointment, Disp: 2 tablet, Rfl: 2   naproxen sodium (ANAPROX DS) 550 MG tablet, Take 1 tablet (550 mg total) by mouth 2 (two) times daily with a meal. As needed for pain, Disp: 30 tablet, Rfl: 2   phentermine (ADIPEX-P) 37.5 MG tablet, Take 1/2 tablet daily x 2 weeks and then daily, Disp: 30 tablet, Rfl: 2   Review of Systems:   ROS Negative unless otherwise specified per HPI.   Vitals:   Vitals:   03/16/22 0956  BP: 122/79  Pulse: 83  Temp: 98.7 F (37.1 C)  SpO2: 98%  Weight: 234 lb 12.8 oz (106.5 kg)  Height: 5' 8.11" (1.73 m)  Body mass index is 35.59 kg/m.  Physical Exam:   Physical Exam Vitals and nursing note reviewed.  Constitutional:      General: She is not in acute distress.    Appearance: She is well-developed. She is not ill-appearing or toxic-appearing.  Cardiovascular:     Rate and Rhythm: Normal rate and regular rhythm.     Pulses: Normal pulses.     Heart sounds: Normal heart sounds, S1 normal and S2 normal.  Pulmonary:     Effort: Pulmonary effort is normal.     Breath sounds: Normal breath sounds.  Musculoskeletal:     Comments: No TTP of foot Normal ROM No obvious swelling  Skin:    General: Skin is warm and dry.  Neurological:     Mental Status: She is alert.     GCS: GCS eye subscore is 4. GCS verbal subscore is 5. GCS motor subscore  is 6.  Psychiatric:        Speech: Speech normal.        Behavior: Behavior normal. Behavior is cooperative.     Assessment and Plan:   Left foot pain History is concerning for plantar fasciitis Recommend meloxicam, supportive shoes (consider heel cups) Handout provided with exercises If ongoing, will refer to sports medicine  Attention or concentration deficit Referral for evaluation  Obesity, unspecified classification, unspecified obesity type, unspecified whether serious comorbidity present Improving No red flag sx with phentermine Refill x 3 months and follow-up  Bilateral hand pain Stable Refill mobic 15 mg daily   I,Victoria Barnett,acting as a scribe for [employer], PA.,have documented all relevant documentation on the behalf of Victoria Coke, PA,as directed by  Victoria Coke, PA while in the presence of Victoria Barnett, [place].   I, Victoria Barnett, [place], have reviewed all documentation for this visit. The documentation on 03/16/22 for the exam, diagnosis, procedures, and orders are all accurate and complete.  Victoria Coke, PA-C

## 2022-05-17 ENCOUNTER — Ambulatory Visit: Payer: BC Managed Care – PPO | Admitting: Psychology

## 2022-05-24 ENCOUNTER — Ambulatory Visit: Payer: BC Managed Care – PPO | Admitting: Psychology

## 2022-06-15 ENCOUNTER — Encounter: Payer: Self-pay | Admitting: Physician Assistant

## 2022-06-15 ENCOUNTER — Ambulatory Visit: Payer: BC Managed Care – PPO | Admitting: Physician Assistant

## 2022-06-15 VITALS — BP 102/60 | HR 60 | Temp 98.0°F | Ht 68.25 in | Wt 227.0 lb

## 2022-06-15 DIAGNOSIS — R41840 Attention and concentration deficit: Secondary | ICD-10-CM

## 2022-06-15 DIAGNOSIS — L732 Hidradenitis suppurativa: Secondary | ICD-10-CM

## 2022-06-15 DIAGNOSIS — E669 Obesity, unspecified: Secondary | ICD-10-CM | POA: Diagnosis not present

## 2022-06-15 DIAGNOSIS — D229 Melanocytic nevi, unspecified: Secondary | ICD-10-CM | POA: Diagnosis not present

## 2022-06-15 MED ORDER — TOPIRAMATE 25 MG PO TABS: 25.0000 mg | ORAL_TABLET | Freq: Every day | ORAL | 0 refills | Status: DC

## 2022-06-15 MED ORDER — PHENTERMINE HCL 37.5 MG PO TABS: 37.5000 mg | ORAL_TABLET | Freq: Every day | ORAL | 2 refills | Status: DC

## 2022-06-15 NOTE — Progress Notes (Signed)
Victoria Barnett is a 31 y.o. female here for a follow up of a pre-existing problem.  History of Present Illness:   Chief Complaint  Patient presents with  . Obesity    HPI  Obesity Currently taking phentermine 37.5 mg daily. She has lost about 7 lb in 3 months however she has had a plateau in her weight over the past month. She is exercising and eating healthy.   Moles Has several atypical moles that she would like evaluated. Used to live in [place] and wasn't always diligent with sunscreen. Would like to see dermatology  Hidradenitis Suppurativa She has hx of this. Usually in her groin and axilla. Has a new one near her bra line on R breast. Has improved. No fever, chills. She has been using an antiseptic wash and this has helped.  Attention Deficit She was planning to for ADHD testing but the option that was given to her was virtual. She prefers an in-person visit. She is wanting different referral today. She continues to struggle with attention issues.    Past Medical History:  Diagnosis Date  . History of hidradenitis suppurativa      Social History   Tobacco Use  . Smoking status: Never  . Smokeless tobacco: Never  Vaping Use  . Vaping Use: Never used  Substance Use Topics  . Alcohol use: Not Currently  . Drug use: Never    History reviewed. No pertinent surgical history.  Family History  Problem Relation Age of Onset  . Lupus Maternal Grandmother   . Arthritis Maternal Grandmother   . Diabetes Maternal Grandfather   . Dementia Maternal Grandfather   . Esophageal cancer Paternal Grandmother   . Thyroid cancer Cousin   . Diabetes Cousin   . Colon cancer Neg Hx   . Breast cancer Neg Hx     No Known Allergies  Current Medications:   Current Outpatient Medications:  .  levonorgestrel (MIRENA) 20 MCG/DAY IUD, by Intrauterine route. Inserted by GYN, Disp: , Rfl:  .  LORazepam (ATIVAN) 0.5 MG tablet, Take 1 tablet (0.5 mg total) by mouth every 6 (six) hours  as needed for anxiety (Around procedure time)., Disp: 10 tablet, Rfl: 0 .  meloxicam (MOBIC) 15 MG tablet, Take 1 tablet (15 mg total) by mouth daily., Disp: 30 tablet, Rfl: 1 .  phentermine (ADIPEX-P) 37.5 MG tablet, Take 1 tablet (37.5 mg total) by mouth daily before breakfast., Disp: 30 tablet, Rfl: 2   Review of Systems:   ROS Negative unless otherwise specified per HPI.  Vitals:   Vitals:   06/15/22 0805  BP: 102/60  Pulse: 60  Temp: 98 F (36.7 C)  TempSrc: Temporal  SpO2: 98%  Weight: 227 lb (103 kg)  Height: 5' 8.25" (1.734 m)     Body mass index is 34.26 kg/m.  Physical Exam:   Physical Exam Vitals and nursing note reviewed.  Constitutional:      General: She is not in acute distress.    Appearance: She is well-developed. She is not ill-appearing or toxic-appearing.  Cardiovascular:     Rate and Rhythm: Normal rate and regular rhythm.     Pulses: Normal pulses.     Heart sounds: Normal heart sounds, S1 normal and S2 normal.  Pulmonary:     Effort: Pulmonary effort is normal.     Breath sounds: Normal breath sounds.  Skin:    General: Skin is warm and dry.     Comments: Erythematous indurated lesion at breast fold  under R breast  Neurological:     Mental Status: She is alert.     GCS: GCS eye subscore is 4. GCS verbal subscore is 5. GCS motor subscore is 6.  Psychiatric:        Speech: Speech normal.        Behavior: Behavior normal. Behavior is cooperative.     Assessment and Plan:   Obesity, unspecified classification, unspecified obesity type, unspecified whether serious comorbidity present Ongoing Continue phentermine 37.5 mg Add topamax 25 mg  Follow-up in 3 months, sooner if concerns  Attention or concentration deficit Referral to [place] [facility]  Hidradenitis suppurativa Uncontrolled Area of concern is improving Offered oral abx but she declined Referral to derm  Multiple atypical skin moles Referral to  derm  Inda Coke, PA-C

## 2022-06-15 NOTE — Patient Instructions (Signed)
It was great to see you!  Referral for [place] [facility] will be placed today  Start 25 mg Topamax at night  Continue 37.5 mg Phentermine during the day = this combo medication is called Qsymia  Follow-up in 3 months  Referral for dermatology for further evaluation for your skin concerns  Take care,  Inda Coke PA-C

## 2022-07-04 DIAGNOSIS — L738 Other specified follicular disorders: Secondary | ICD-10-CM | POA: Diagnosis not present

## 2022-07-06 ENCOUNTER — Other Ambulatory Visit: Payer: Self-pay | Admitting: Physician Assistant

## 2022-09-09 IMAGING — DX DG CHEST 2V
2 series · 2 of 2 positions shown · non-contrast
Comparison: Prior chest x-ray 01/12/2020

CLINICAL DATA: Cough for 2 weeks

EXAM:
CHEST - 2 VIEW

[chest pa]
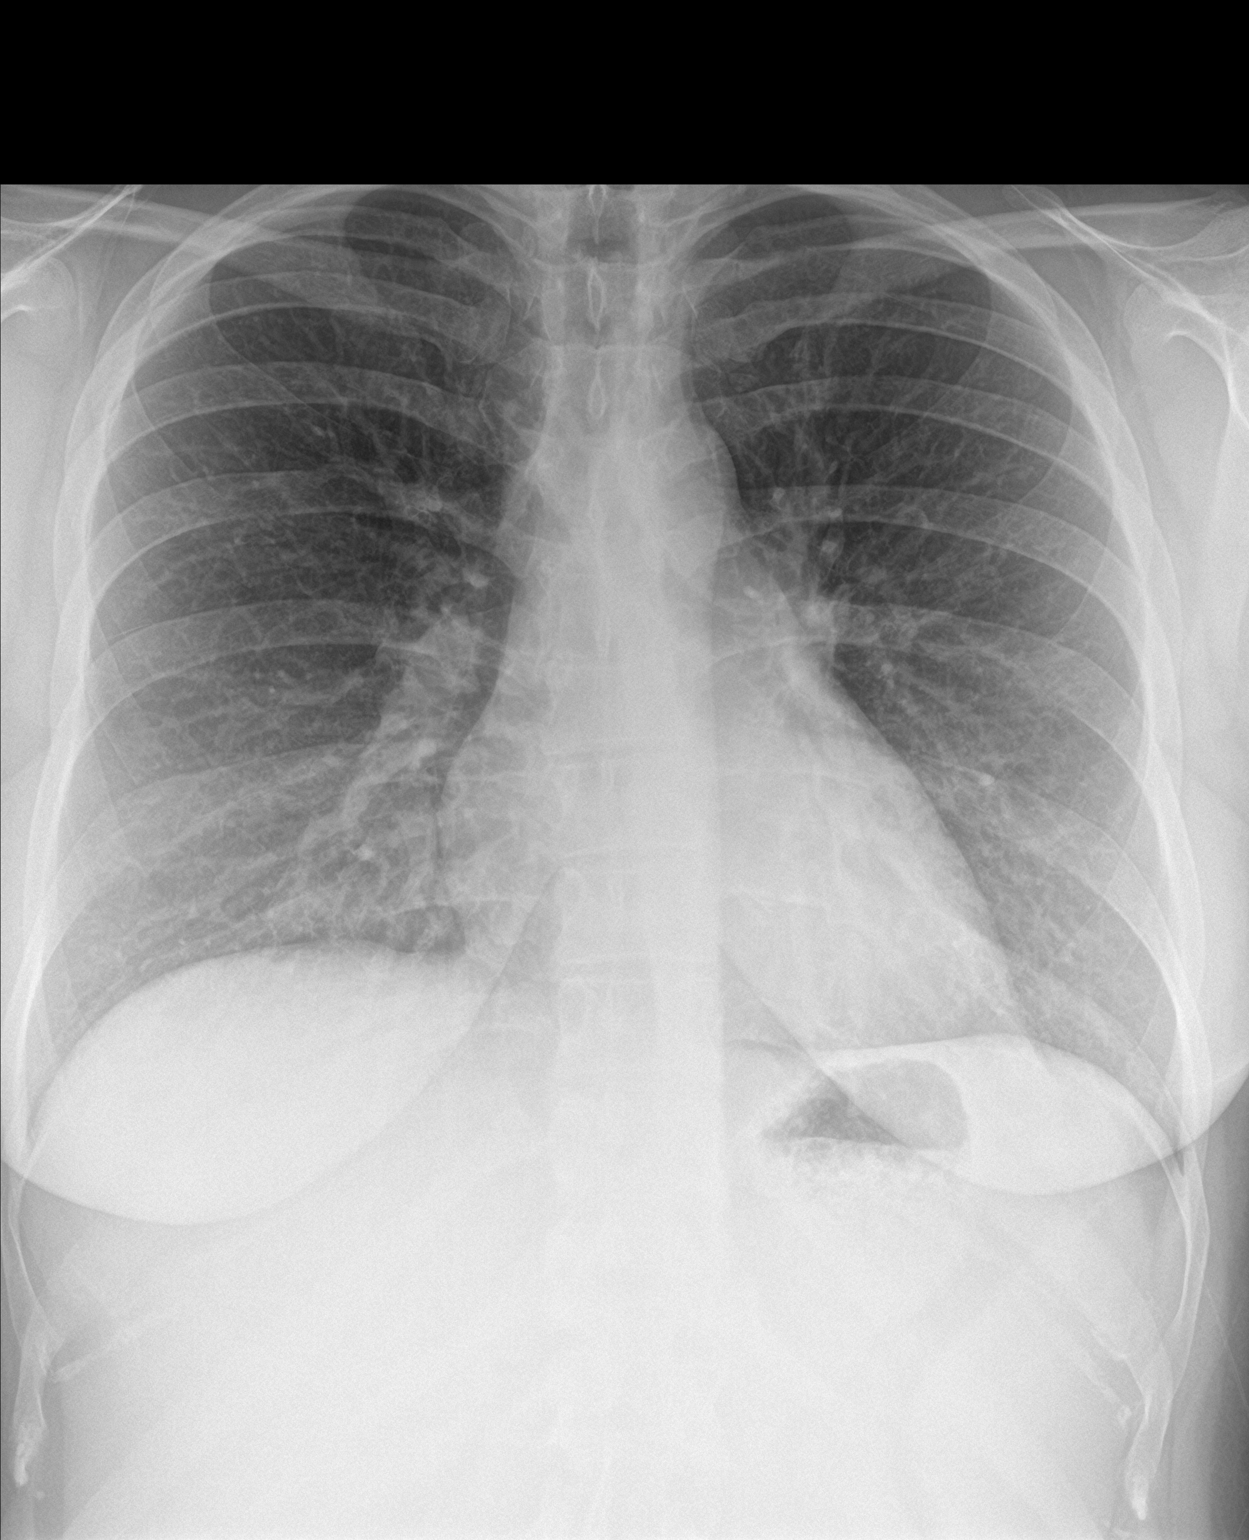

[chest lat]
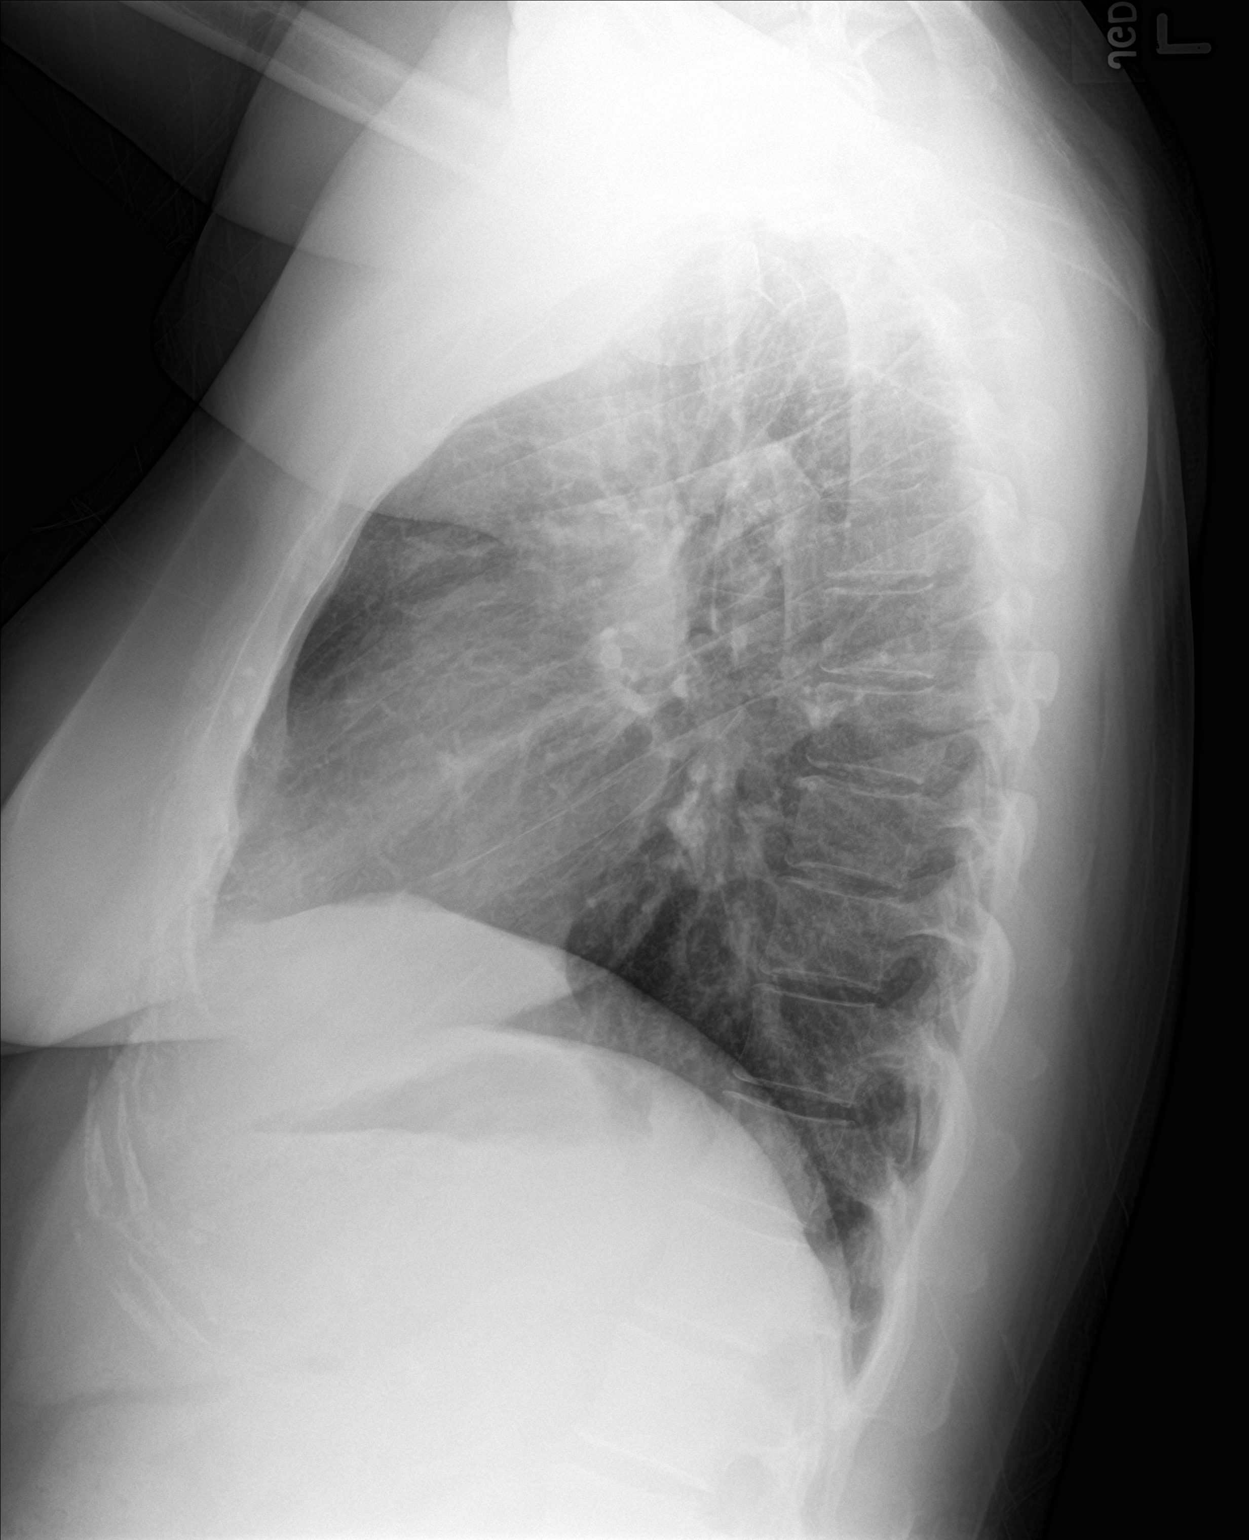

[2 of 2 positions shown; findings below may reference images not displayed]

FINDINGS: The lungs are clear and negative for focal airspace consolidation,
pulmonary edema or suspicious pulmonary nodule. No pleural effusion
or pneumothorax. Cardiac and mediastinal contours are within normal
limits. No acute fracture or lytic or blastic osseous lesions. The
visualized upper abdominal bowel gas pattern is unremarkable.
IMPRESSION: No active cardiopulmonary disease.

## 2022-09-14 ENCOUNTER — Encounter: Payer: Self-pay | Admitting: Physician Assistant

## 2022-09-14 ENCOUNTER — Ambulatory Visit: Payer: BC Managed Care – PPO | Admitting: Physician Assistant

## 2022-09-14 VITALS — BP 120/84 | HR 90 | Temp 97.8°F | Wt 221.6 lb

## 2022-09-14 DIAGNOSIS — E669 Obesity, unspecified: Secondary | ICD-10-CM | POA: Diagnosis not present

## 2022-09-14 DIAGNOSIS — R41840 Attention and concentration deficit: Secondary | ICD-10-CM | POA: Diagnosis not present

## 2022-09-14 DIAGNOSIS — K5900 Constipation, unspecified: Secondary | ICD-10-CM | POA: Diagnosis not present

## 2022-09-14 NOTE — Patient Instructions (Addendum)
It was great to see you!  Look over [employer] information and message me if interested Look over constipation handout and try to work on this  Follow-up in 6 months for a physical - sooner if concerns  Take care,  Inda Coke PA-C

## 2022-09-14 NOTE — Progress Notes (Signed)
Victoria Barnett is a 31 y.o. female here for a follow up of a pre-existing problem.  History of Present Illness:   Chief Complaint  Patient presents with   Obesity   ADHD    Never contacted by [facility]    HPI  Obesity Walking daily She took topamax 25 mg x 1 -- but did internet research and felt like she was worried about potential side effects Stopped phentermine -- felt like it made her irritable . ADHD She is not interested in further ADHD evaluation She was contacted by [facility] -- but no longer wants to pursue this at this time Denies any ongoing concerns  Constipation Eating more protein and feels like this is causing issues with slower GI habits Has tried sugar-free metamucil x 1 -- wants to know if this is ok Denies: rectal bleeding, rectal pain, n/v    Past Medical History:  Diagnosis Date   History of hidradenitis suppurativa      Social History   Tobacco Use   Smoking status: Never   Smokeless tobacco: Never  Vaping Use   Vaping Use: Never used  Substance Use Topics   Alcohol use: Not Currently   Drug use: Never    History reviewed. No pertinent surgical history.  Family History  Problem Relation Age of Onset   Lupus Maternal Grandmother    Arthritis Maternal Grandmother    Diabetes Maternal Grandfather    Dementia Maternal Grandfather    Esophageal cancer Paternal Grandmother    Thyroid cancer Cousin    Diabetes Cousin    Colon cancer Neg Hx    Breast cancer Neg Hx     No Known Allergies  Current Medications:   Current Outpatient Medications:    levonorgestrel (MIRENA) 20 MCG/DAY IUD, by Intrauterine route. Inserted by GYN, Disp: , Rfl:    LORazepam (ATIVAN) 0.5 MG tablet, Take 1 tablet (0.5 mg total) by mouth every 6 (six) hours as needed for anxiety (Around procedure time)., Disp: 10 tablet, Rfl: 0   meloxicam (MOBIC) 15 MG tablet, TAKE 1 TABLET (15 MG TOTAL) BY MOUTH DAILY., Disp: 30 tablet,  Rfl: 1   phentermine (ADIPEX-P) 37.5 MG tablet, Take 1 tablet (37.5 mg total) by mouth daily before breakfast., Disp: 30 tablet, Rfl: 2   topiramate (TOPAMAX) 25 MG tablet, Take 1 tablet (25 mg total) by mouth at bedtime., Disp: 90 tablet, Rfl: 0   Review of Systems:   ROS Negative unless otherwise specified per HPI.  Vitals:   Vitals:   09/14/22 0806  BP: 120/84  Pulse: 90  Temp: 97.8 F (36.6 C)  TempSrc: Temporal  SpO2: 97%  Weight: 221 lb 9.6 oz (100.5 kg)     Body mass index is 33.45 kg/m.  Physical Exam:   Physical Exam Constitutional:      Appearance: Normal appearance. She is well-developed.  HENT:     Head: Normocephalic and atraumatic.  Eyes:     General: Lids are normal.     Extraocular Movements: Extraocular movements intact.     Conjunctiva/sclera: Conjunctivae normal.  Pulmonary:     Effort: Pulmonary effort is normal.  Musculoskeletal:        General: Normal range of motion.     Cervical back: Normal range of motion and neck supple.  Skin:    General: Skin is warm and dry.  Neurological:     Mental Status: She is alert and oriented to person, place, and time.  Psychiatric:  Attention and Perception: Attention and perception normal.        Mood and Affect: Mood normal.        Behavior: Behavior normal.        Thought Content: Thought content normal.        Judgment: Judgment normal.     Assessment and Plan:   Obesity, unspecified classification, unspecified obesity type, unspecified whether serious comorbidity present Continue healthy lifestyle Consider Zepbound -- discussed. She will research and let us know if she would like to pursue this.  Attention or concentration deficit Declines intervention or further work-up Continue to monitor and follow-up as needed  Constipation, unspecified constipation type No red flags Handout provided Recommend miralax prn Push fluids and fiber Follow-up if any concerns    Inda Coke,  PA-C

## 2022-09-15 ENCOUNTER — Other Ambulatory Visit: Payer: Self-pay | Admitting: Physician Assistant

## 2022-09-15 ENCOUNTER — Encounter: Payer: Self-pay | Admitting: Physician Assistant

## 2022-09-15 MED ORDER — ZEPBOUND 2.5 MG/0.5ML ~~LOC~~ SOAJ: 2.5000 mg | SUBCUTANEOUS | 0 refills | Status: DC

## 2023-01-29 NOTE — Progress Notes (Signed)
Tawni Melkonian is a 32 y.o. female here for a {New prob or follow up:31724}.  History of Present Illness:   No chief complaint on file.   HPI  Allergy testing: ***   Past Medical History:  Diagnosis Date   History of hidradenitis suppurativa      Social History   Tobacco Use   Smoking status: Never   Smokeless tobacco: Never  Vaping Use   Vaping Use: Never used  Substance Use Topics   Alcohol use: Not Currently   Drug use: Never    No past surgical history on file.  Family History  Problem Relation Age of Onset   Lupus Maternal Grandmother    Arthritis Maternal Grandmother    Diabetes Maternal Grandfather    Dementia Maternal Grandfather    Esophageal cancer Paternal Grandmother    Thyroid cancer Cousin        PAPILLARY   Diabetes Cousin    Colon cancer Neg Hx    Breast cancer Neg Hx     No Known Allergies  Current Medications:   Current Outpatient Medications:    Tirzepatide-Weight Management (ZEPBOUND) 2.5 MG/0.5ML SOAJ, Inject 2.5 mg into the skin once a week., Disp: 2 mL, Rfl: 0   levonorgestrel (MIRENA) 20 MCG/DAY IUD, by Intrauterine route. Inserted by GYN, Disp: , Rfl:    LORazepam (ATIVAN) 0.5 MG tablet, Take 1 tablet (0.5 mg total) by mouth every 6 (six) hours as needed for anxiety (Around procedure time)., Disp: 10 tablet, Rfl: 0   meloxicam (MOBIC) 15 MG tablet, TAKE 1 TABLET (15 MG TOTAL) BY MOUTH DAILY., Disp: 30 tablet, Rfl: 1   phentermine (ADIPEX-P) 37.5 MG tablet, Take 1 tablet (37.5 mg total) by mouth daily before breakfast., Disp: 30 tablet, Rfl: 2   topiramate (TOPAMAX) 25 MG tablet, Take 1 tablet (25 mg total) by mouth at bedtime., Disp: 90 tablet, Rfl: 0   Review of Systems:   ROS  Vitals:   There were no vitals filed for this visit.   There is no height or weight on file to calculate BMI.  Physical Exam:   Physical Exam  Assessment and Plan:   There are no diagnoses linked to this encounter.  I,Rachel Rivera,acting as a  Neurosurgeon for [employer], PA.,have documented all relevant documentation on the behalf of Jarold Motto, PA,as directed by  Jarold Motto, PA while in the presence of Jarold Motto, [place].  ***  Jarold Motto, PA-C

## 2023-01-30 ENCOUNTER — Ambulatory Visit: Payer: BC Managed Care – PPO | Admitting: Physician Assistant

## 2023-01-30 VITALS — BP 116/80 | HR 76 | Temp 98.0°F | Ht 68.25 in | Wt 231.0 lb

## 2023-01-30 DIAGNOSIS — T7840XA Allergy, unspecified, initial encounter: Secondary | ICD-10-CM

## 2023-01-30 DIAGNOSIS — E669 Obesity, unspecified: Secondary | ICD-10-CM | POA: Diagnosis not present

## 2023-01-30 MED ORDER — ALBUTEROL SULFATE HFA 108 (90 BASE) MCG/ACT IN AERS: 2.0000 | INHALATION_SPRAY | Freq: Four times a day (QID) | RESPIRATORY_TRACT | 2 refills | Status: AC | PRN

## 2023-01-30 MED ORDER — MONTELUKAST SODIUM 10 MG PO TABS: 10.0000 mg | ORAL_TABLET | Freq: Every day | ORAL | 1 refills | Status: DC

## 2023-01-30 NOTE — Patient Instructions (Signed)
It was great to see you!  Recommend call your insurance company to see if  Reginal Lutes or Reginia Forts is covered under your insurance -- let me know if it is, and I will try to send in.  Start singulair 10 mg nightly Continue allergy pill (zyrtec) daily Albuterol refilled  Allergy referral placed today  Take care,  Jarold Motto PA-C

## 2023-02-21 ENCOUNTER — Other Ambulatory Visit: Payer: Self-pay | Admitting: Physician Assistant

## 2023-02-26 ENCOUNTER — Other Ambulatory Visit: Payer: Self-pay | Admitting: Physician Assistant

## 2023-03-13 ENCOUNTER — Encounter: Payer: BC Managed Care – PPO | Admitting: Physician Assistant

## 2023-03-21 ENCOUNTER — Ambulatory Visit: Payer: BC Managed Care – PPO | Admitting: Allergy

## 2023-05-02 ENCOUNTER — Ambulatory Visit: Payer: BC Managed Care – PPO | Admitting: Allergy

## 2023-08-12 IMAGING — US US THYROID
1 series · 13 of 25 positions shown · non-contrast
Comparison: None.

CLINICAL DATA: Thyromegaly

EXAM:
THYROID ULTRASOUND
TECHNIQUE: Ultrasound examination of the thyroid gland and adjacent soft
tissues was performed.

[Series 1: us thyroid · 0.05mm/px · 13 of 54 slices shown]
[im 1/54]
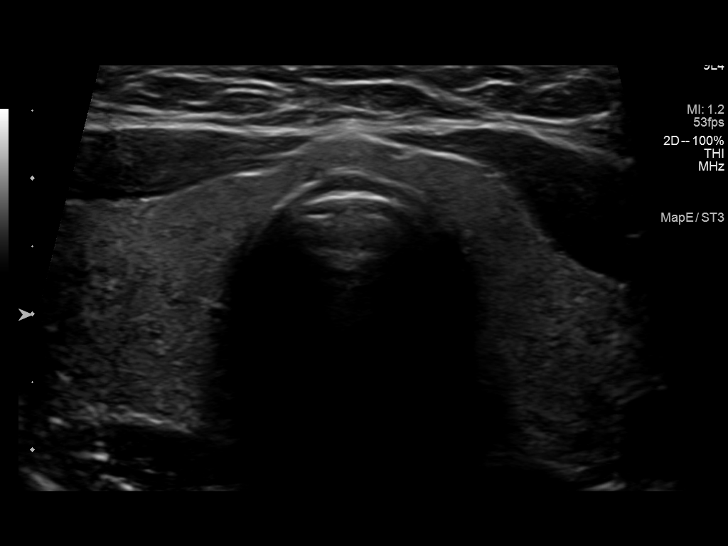
[im 5/54]
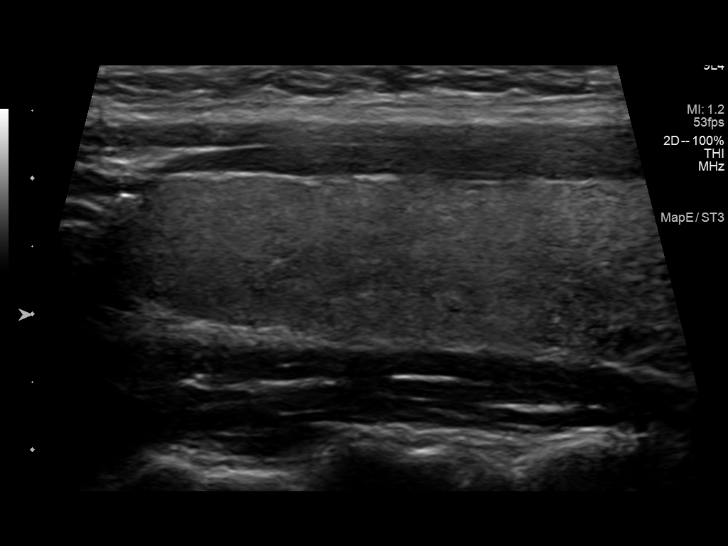
[im 9/54]
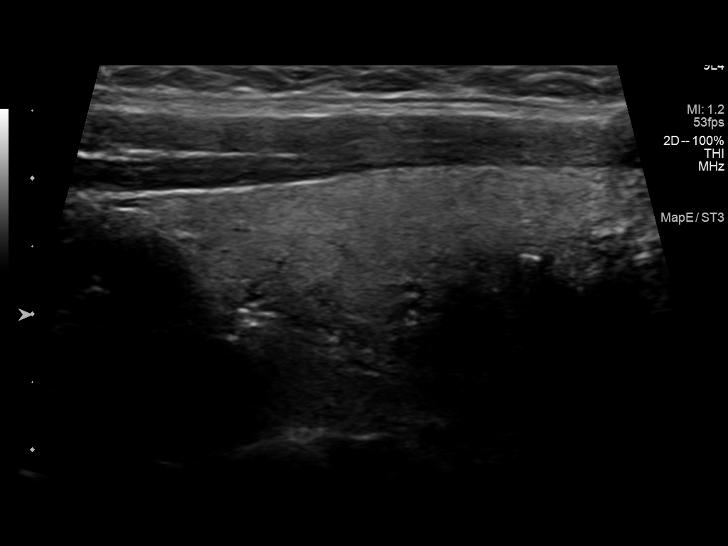
[im 14/54]
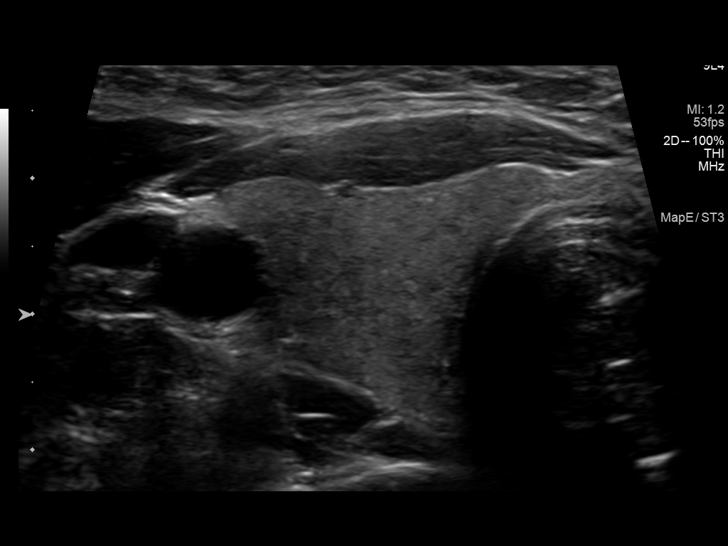
[im 18/54]
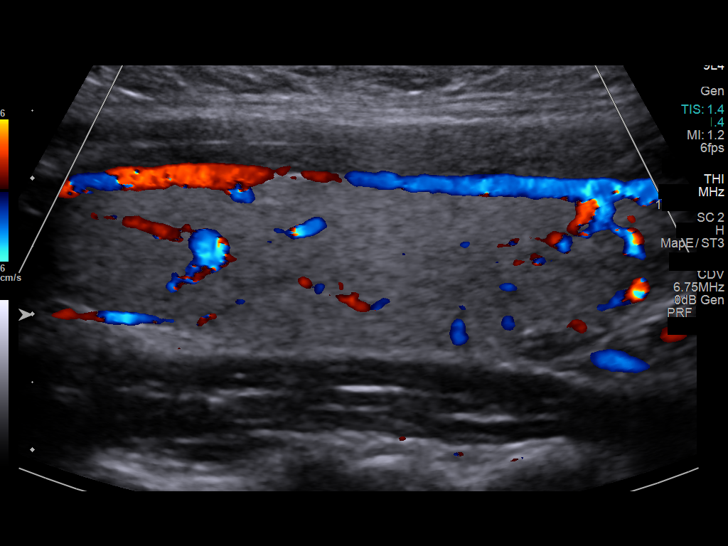
[im 23/54]
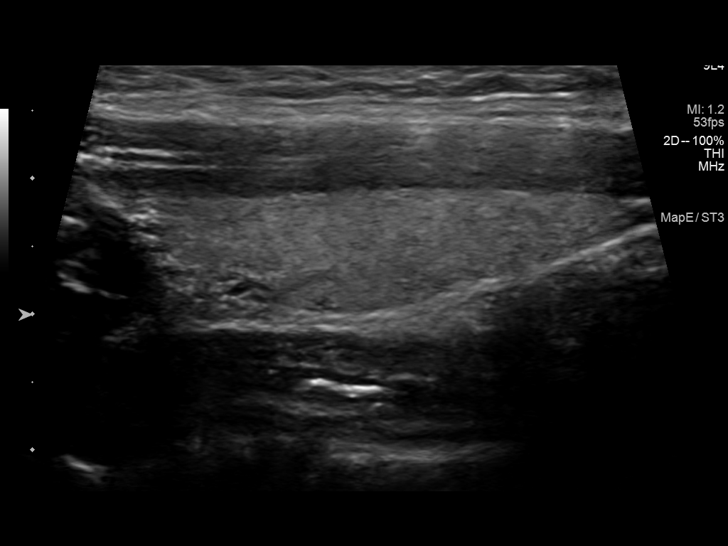
[im 27/54]
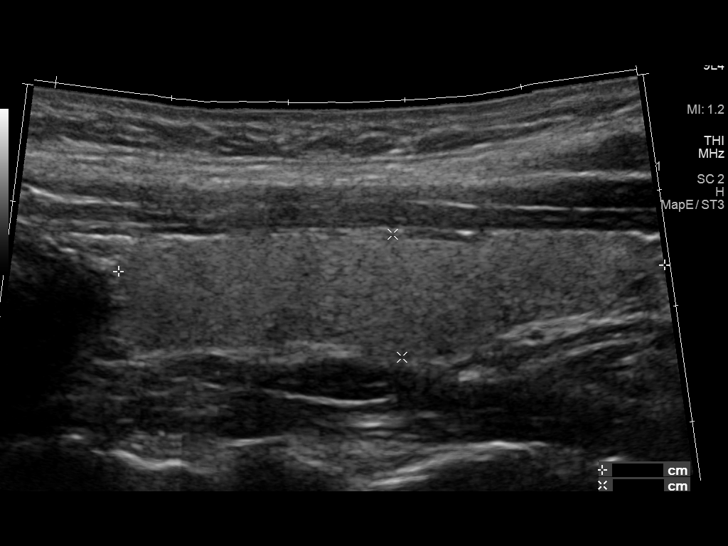
[im 31/54]
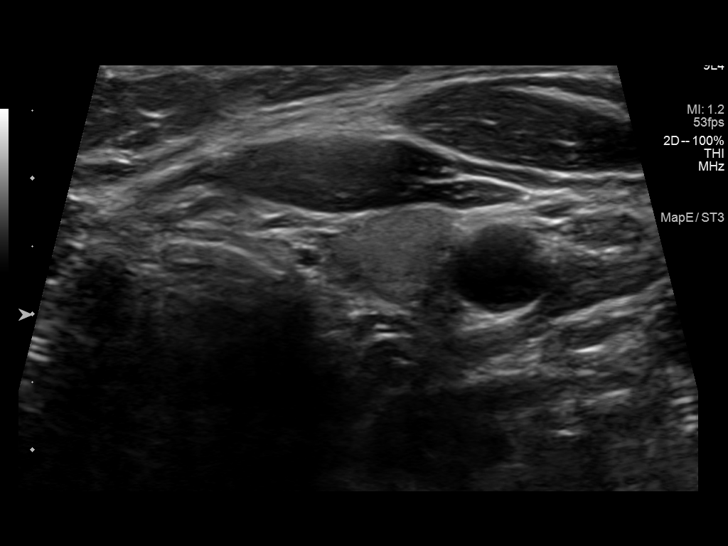
[im 36/54]
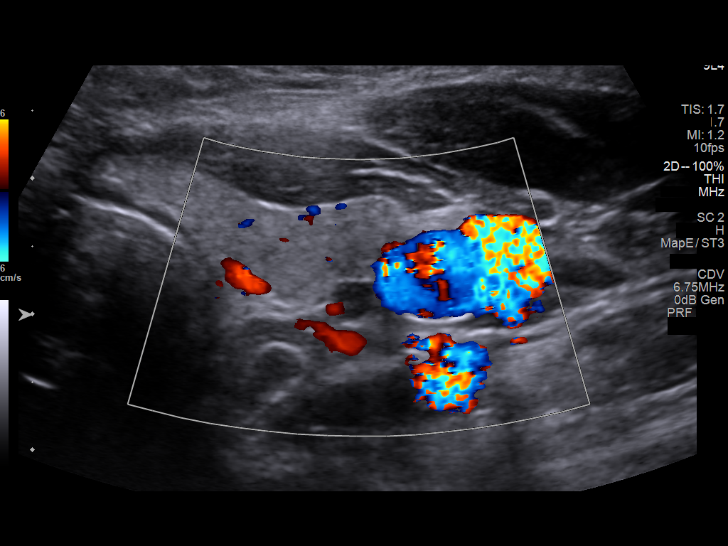
[im 40/54]
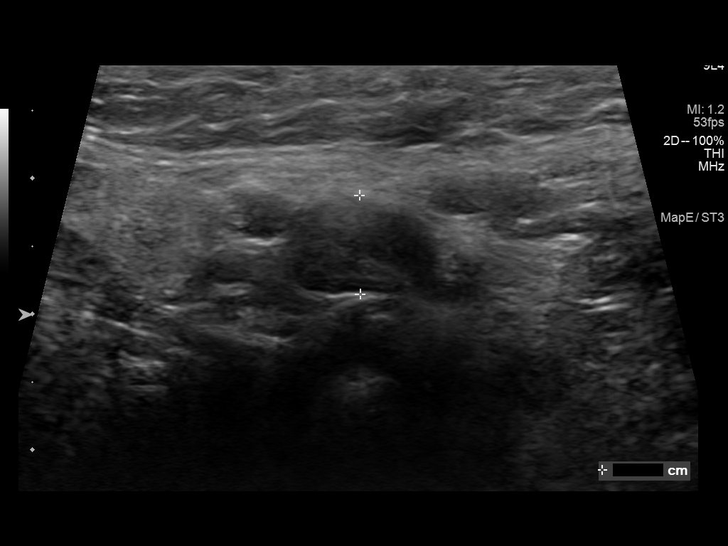
[im 45/54]
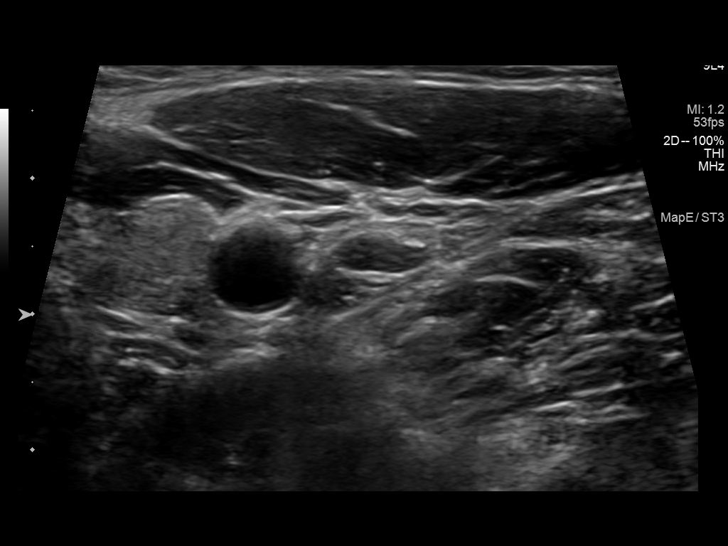
[im 49/54]
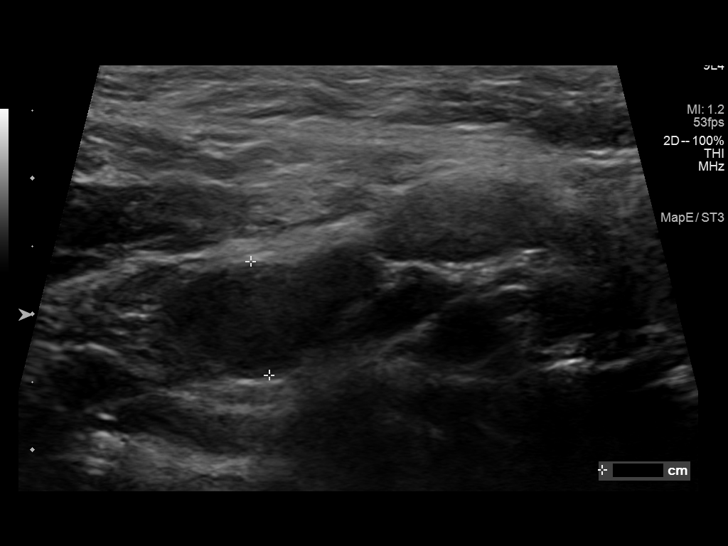
[im 54/54]
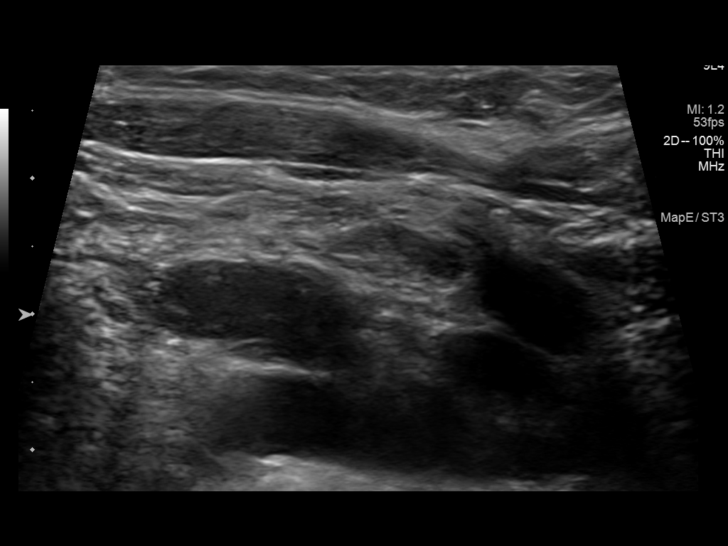

[13 of 25 positions shown; findings below may reference images not displayed]

FINDINGS: Parenchymal Echotexture: Normal

Isthmus: 2 mm

Right lobe: 5.2 x 1.3 x 2.2 cm

Left lobe: 4.7 x 1.1 x 1.6 cm

_________________________________________________________

Estimated total number of nodules >/= 1 cm: 0

Number of spongiform nodules >/=  2 cm not described below (TR1): 0

Number of mixed cystic and solid nodules >/= 1.5 cm not described
below (TR2): 0

_________________________________________________________

Normal thyroid echotexture. Incidental left inferior thyroid
subcentimeter hypoechoic nodule measures only 4 mm. No other
significant thyroid abnormality.

Mildly enlarged / prominent lymph nodes noted bilaterally measuring
9 mm in short axis on the left and 8 mm in short axis on the right.
These remain nonspecific.
IMPRESSION: No significant thyroid abnormality.

Nonspecific prominent bilateral cervical lymph nodes. These are
favored to be reactive. Consider follow-up to document stability.

The above is in keeping with the ACR TI-RADS recommendations - [HOSPITAL] 2997;[DATE].
